# Patient Record
Sex: Male | Born: 1980 | Hispanic: Yes | Marital: Married
Health system: Southern US, Community
[De-identification: ages and names within clinical notes are randomized; demographics above are authoritative.]

---

## 2004-04-10 ENCOUNTER — Emergency Department: Payer: Self-pay | Admitting: Emergency Medicine

## 2004-09-27 ENCOUNTER — Emergency Department: Payer: Self-pay | Admitting: Emergency Medicine

## 2004-10-19 ENCOUNTER — Emergency Department: Payer: Self-pay | Admitting: Emergency Medicine

## 2004-11-12 ENCOUNTER — Emergency Department: Payer: Self-pay | Admitting: Emergency Medicine

## 2004-11-21 ENCOUNTER — Emergency Department: Payer: Self-pay | Admitting: Emergency Medicine

## 2005-01-16 ENCOUNTER — Emergency Department: Payer: Self-pay | Admitting: Emergency Medicine

## 2006-02-04 IMAGING — CR LEFT WRIST - COMPLETE 3+ VIEW
1 series · 2 of 2 positions shown · non-contrast
Comparison: none

REASON FOR EXAM: injury
COMMENTS:  LMP: (Male)

PROCEDURE:     DXR - DXR WRIST LT COMP WITH OBLIQUES  - October 19, 2004  [DATE]
RESULT:     Four views of the LEFT wrist show no fracture, dislocation or
other acute bony abnormality.

[Series 1: view not recorded · 0.17mm/px · 2 of 2 slices shown]
[im 1/2]
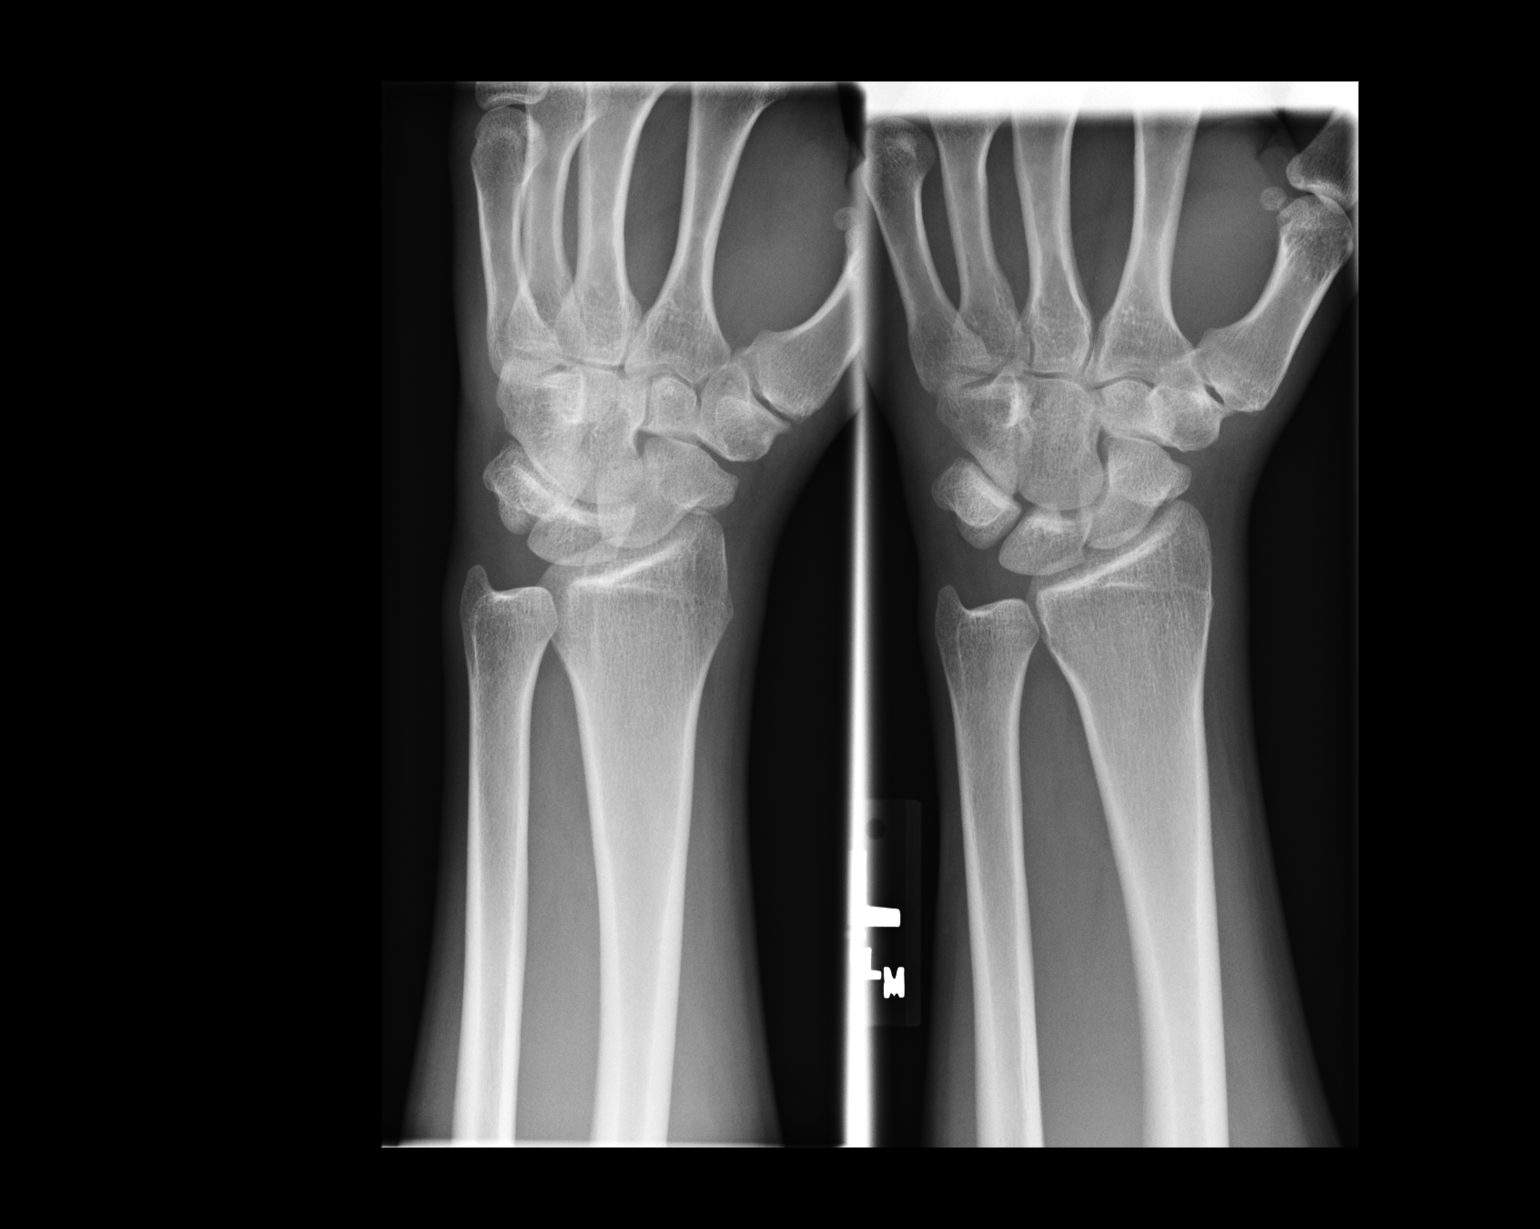
[im 2/2]
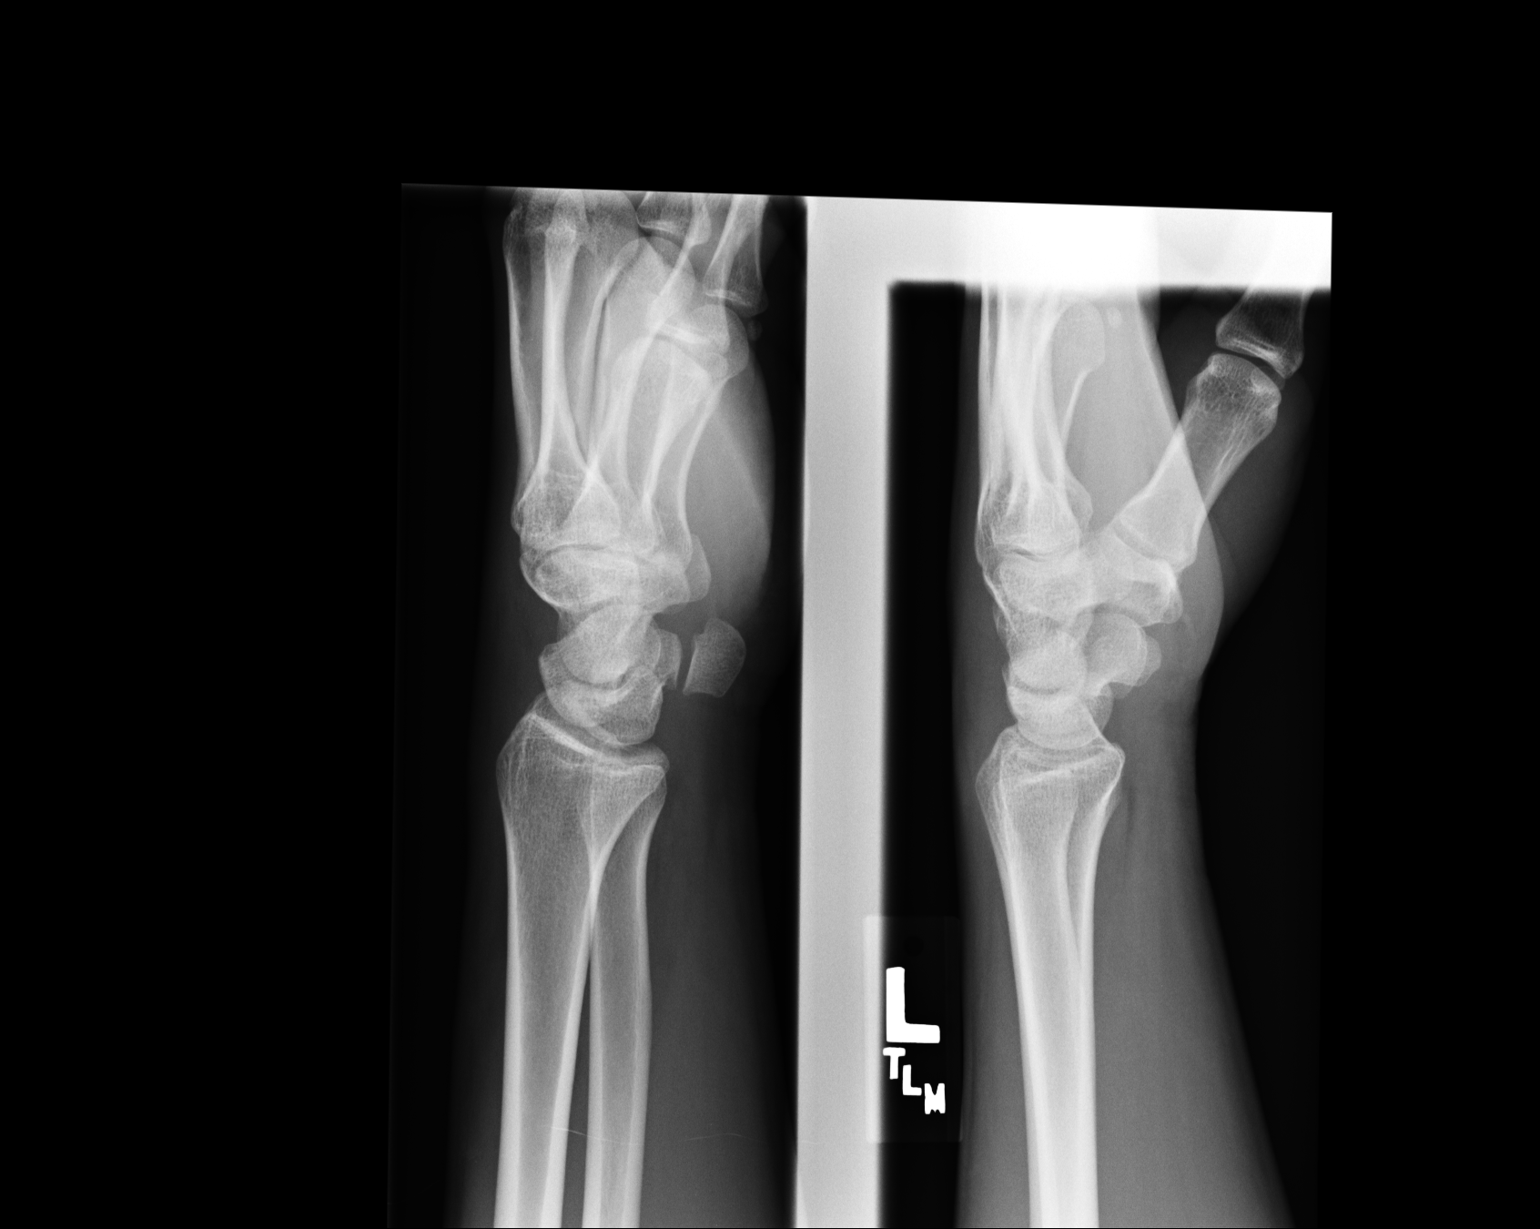

[2 of 2 positions shown; findings below may reference images not displayed]

IMPRESSION: 1)No acute changes are identified.

## 2022-05-31 ENCOUNTER — Emergency Department
Admission: EM | Admit: 2022-05-31 | Discharge: 2022-05-31 | Disposition: A | Discharge status: Home / Self Care | Payer: Medicaid Other | Attending: Emergency Medicine | Admitting: Emergency Medicine

## 2022-05-31 ENCOUNTER — Other Ambulatory Visit: Payer: Self-pay

## 2022-05-31 ENCOUNTER — Emergency Department: Payer: Medicaid Other

## 2022-05-31 DIAGNOSIS — S60512A Abrasion of left hand, initial encounter: Secondary | ICD-10-CM | POA: Diagnosis not present

## 2022-05-31 DIAGNOSIS — W540XXA Bitten by dog, initial encounter: Secondary | ICD-10-CM | POA: Diagnosis not present

## 2022-05-31 DIAGNOSIS — S71131A Puncture wound without foreign body, right thigh, initial encounter: Secondary | ICD-10-CM | POA: Diagnosis present

## 2022-05-31 MED ORDER — AMOXICILLIN-POT CLAVULANATE 875-125 MG PO TABS: 1.0000 | ORAL_TABLET | Freq: Two times a day (BID) | ORAL | 0 refills | Status: AC

## 2022-05-31 MED ORDER — TRAMADOL HCL 50 MG PO TABS: 50.0000 mg | ORAL_TABLET | Freq: Four times a day (QID) | ORAL | 0 refills | Status: AC | PRN

## 2022-05-31 MED ORDER — IBUPROFEN 800 MG PO TABS
800.0000 mg | ORAL_TABLET | Freq: Once | ORAL | Status: AC
  Administered 2022-05-31: 800 mg via ORAL
  Filled 2022-05-31: qty 1

## 2022-05-31 NOTE — ED Provider Notes (Signed)
[facility] Provider Note    Event Date/Time   First MD Initiated Contact with Patient 05/31/22 1125     (approximate)   History   Animal Bite   HPI  B[facility] is a 42 y.o. male with no significant past medical history presents emergency department with dog bite to the left hand and right thigh, patient was at his friend's house when the dog bit him.  States he does not know what happened with the dog.  It is a bully type dog.  States the dog's immunizations are up-to-date.  Patient's Tdap was approximately 3 months ago.      Physical Exam   Triage Vital Signs: ED Triage Vitals  Enc Vitals Group     BP 05/31/22 1032 (!) 140/95     Pulse Rate 05/31/22 1032 97     Resp 05/31/22 1032 16     Temp 05/31/22 1032 98.2 F (36.8 C)     Temp Source 05/31/22 1032 Oral     SpO2 05/31/22 1032 98 %     Weight 05/31/22 1031 165 lb (74.8 kg)     Height 05/31/22 1031 5\' 11"  (1.803 m)     Head Circumference --      Peak Flow --      Pain Score 05/31/22 1031 10     Pain Loc --      Pain Edu? --      Excl. in [place]? --     Most recent vital signs: Vitals:   05/31/22 1032  BP: (!) 140/95  Pulse: 97  Resp: 16  Temp: 98.2 F (36.8 C)  SpO2: 98%     General: Awake, no distress.   CV:  Good peripheral perfusion. regular rate and  rhythm Resp:  Normal effort.  Abd:  No distention.   Other:  Right thigh with multiple puncture wounds from dog bite, left hand with large abrasions, some tenderness along the first metacarpal, area still oozing, neurovascular is intact, no foreign body noted   ED Results / Procedures / Treatments   Labs (all labs ordered are listed, but only abnormal results are displayed) Labs Reviewed - No data to display   EKG     RADIOLOGY X-ray of the left hand    PROCEDURES:   Procedures   MEDICATIONS ORDERED IN ED: Medications  ibuprofen (ADVIL) tablet 800 mg (has no administration in time range)      IMPRESSION / MDM / ASSESSMENT AND PLAN / ED COURSE  I reviewed the triage vital signs and the nursing notes.                              Differential diagnosis includes, but is not limited to, laceration, dog bite, foreign body, fracture  Patient's presentation is most consistent with acute complicated illness / injury requiring diagnostic workup.   X-ray of the left hand independently reviewed and interpreted by me as being negative for any acute abnormality  The left hand and right thigh were cleaned with normal saline, nursing staff applied Steri-Strips to the hand.  I did explain to the patient we do not suture dog bites as they create a large amount of infection.  He is placed on Augmentin.  Tramadol for pain.  Return to the emergency department if any sign of infection.  Follow-up with his regular doctor as needed.  He is in agreement treatment plan.  Discharged  stable condition.      FINAL CLINICAL IMPRESSION(S) / ED DIAGNOSES   Final diagnoses:  Dog bite, initial encounter     Rx / DC Orders   ED Discharge Orders          Ordered    amoxicillin-clavulanate (AUGMENTIN) 875-125 MG tablet  2 times daily        05/31/22 1233    traMADol (ULTRAM) 50 MG tablet  Every 6 hours PRN        05/31/22 1233             Note:  This document was prepared using Dragon voice recognition software and may include unintentional dictation errors.    Versie Starks, PA-C 05/31/22 1301    Naaman Plummer, MD 05/31/22 [phone]

## 2022-05-31 NOTE — ED Triage Notes (Signed)
Pt states that he was bitten by a friends dog last night. Pt reports that dog is UTD on his shots.

## 2022-05-31 NOTE — Discharge Instructions (Signed)
Follow up with your regular doctor if not improving in 3 to 4 days Take the medication as prescribed Clean the area daily with mild soap and water

## 2022-05-31 NOTE — ED Notes (Signed)
Pt states that he was bitten by a friends dog. Pt states that the dog is UTD on his shots and he has not reported the bite. Pt has left hand wrapped. Pt is in NAD at this time.

## 2023-01-16 ENCOUNTER — Emergency Department (HOSPITAL_COMMUNITY): Payer: Medicaid Other

## 2023-01-16 ENCOUNTER — Other Ambulatory Visit: Payer: Self-pay

## 2023-01-16 ENCOUNTER — Emergency Department (HOSPITAL_COMMUNITY)
Admission: EM | Admit: 2023-01-16 | Discharge: 2023-01-16 | Disposition: A | Discharge status: Home / Self Care | Payer: Medicaid Other | Attending: Emergency Medicine | Admitting: Emergency Medicine

## 2023-01-16 DIAGNOSIS — E876 Hypokalemia: Secondary | ICD-10-CM | POA: Diagnosis not present

## 2023-01-16 DIAGNOSIS — R0789 Other chest pain: Secondary | ICD-10-CM | POA: Insufficient documentation

## 2023-01-16 LAB — BASIC METABOLIC PANEL
Anion gap: 7 (ref 5–15)
BUN: 15 mg/dL (ref 6–20)
CO2: 23 mmol/L (ref 22–32)
Calcium: 8.2 mg/dL — ABNORMAL LOW (ref 8.9–10.3)
Chloride: 107 mmol/L (ref 98–111)
Creatinine, Ser: 0.72 mg/dL (ref 0.61–1.24)
GFR, Estimated: >60 mL/min (ref >60)
Glucose, Bld: 106 mg/dL — ABNORMAL HIGH (ref 70–99)
Potassium: 3.2 mmol/L — ABNORMAL LOW (ref 3.5–5.1)
Sodium: UNDETERMINED mmol/L (ref 135–145)
Sodium: 137 mmol/L (ref 135–145)

## 2023-01-16 LAB — CBC
HCT: 45.6 % (ref 39.0–52.0)
Hemoglobin: 15 g/dL (ref 13.0–17.0)
MCH: 29.8 pg (ref 26.0–34.0)
MCHC: 32.9 g/dL (ref 30.0–36.0)
MCV: 90.5 fL (ref 80.0–100.0)
Platelets: 272 10*3/uL (ref 150–400)
RBC: 5.04 MIL/uL (ref 4.22–5.81)
RDW: 13.1 % (ref 11.5–15.5)
WBC: 11.7 10*3/uL — ABNORMAL HIGH (ref 4.0–10.5)
nRBC: 0 % (ref 0.0–0.2)

## 2023-01-16 LAB — TROPONIN I (HIGH SENSITIVITY): Troponin I (High Sensitivity): 2 ng/L (ref <18)

## 2023-01-16 MED ORDER — KETOROLAC TROMETHAMINE 30 MG/ML IJ SOLN: 30.0000 mg | Freq: Once | INTRAMUSCULAR | Status: DC

## 2023-01-16 MED ORDER — KETOROLAC TROMETHAMINE 15 MG/ML IJ SOLN: 15.0000 mg | Freq: Once | INTRAMUSCULAR | Status: DC

## 2023-01-16 NOTE — ED Provider Notes (Signed)
[place] EMERGENCY DEPARTMENT AT [facility] Provider Note   CSN: [id number] Arrival date & time: 01/16/23  4098     History  Chief Complaint  Patient presents with   Chest Pain    Eldrige J Aarion [place] is a 42 y.o. male.  HPI Patient presents with pain in the right arm radiating towards the chest. Patient denies medical problems, states that he is generally well.  Just prior to ED arrival the patient was reaching for something, felt sharp pain radiating from his right mid biceps through to his chest.  No left-sided pain, no dyspnea, no syncope, no neck pain.  Patient notes that he had a friend die of a heart attack last week, and he is concerned about this.     Home Medications Prior to Admission medications   Medication Sig Start Date End Date Taking? Authorizing Provider  amoxicillin-clavulanate (AUGMENTIN) 875-125 MG tablet Take 1 tablet by mouth 2 (two) times daily. Patient not taking: Reported on 01/16/2023 05/31/22   Faythe Ghee, PA-C  traMADol (ULTRAM) 50 MG tablet Take 1 tablet (50 mg total) by mouth every 6 (six) hours as needed. Patient not taking: Reported on 01/16/2023 05/31/22   Faythe Ghee, PA-C      Allergies    Patient has no known allergies.    Review of Systems   Review of Systems  All other systems reviewed and are negative.   Physical Exam Updated Vital Signs BP 117/86   Pulse 72   Temp 98 F (36.7 C) (Oral)   Resp 18   Ht 5\' 11"  (1.803 m)   Wt 75 kg   SpO2 96%   BMI 23.06 kg/m  Physical Exam Vitals and nursing note reviewed.  Constitutional:      General: He is not in acute distress.    Appearance: He is well-developed.  HENT:     Head: Normocephalic and atraumatic.  Eyes:     Conjunctiva/sclera: Conjunctivae normal.  Cardiovascular:     Rate and Rhythm: Normal rate and regular rhythm.  Pulmonary:     Effort: Pulmonary effort is normal. No respiratory distress.     Breath sounds: No stridor.  Abdominal:      General: There is no distension.  Musculoskeletal:       Arms:  Skin:    General: Skin is warm and dry.  Neurological:     Mental Status: He is alert and oriented to person, place, and time.     ED Results / Procedures / Treatments   Labs (all labs ordered are listed, but only abnormal results are displayed) Labs Reviewed  CBC - Abnormal; Notable for the following components:      Result Value   WBC 11.7 (*)    All other components within normal limits  BASIC METABOLIC PANEL - Abnormal; Notable for the following components:   Potassium 3.2 (*)    Glucose, Bld 106 (*)    Calcium 8.2 (*)    All other components within normal limits  BASIC METABOLIC PANEL  TROPONIN I (HIGH SENSITIVITY)  TROPONIN I (HIGH SENSITIVITY)  TROPONIN I (HIGH SENSITIVITY)    EKG EKG Interpretation Date/Time:  Tuesday January 16 2023 09:48:18 EDT Ventricular Rate:  80 PR Interval:  175 QRS Duration:  108 QT Interval:  369 QTC Calculation: 426 R Axis:   -8  Text Interpretation: Sinus rhythm Probable left atrial enlargement Confirmed by Gerhard Munch [phone]) on 01/16/2023 11:48:04 AM  Radiology DG Chest 2 View  Result Date: 01/16/2023 CLINICAL DATA:  Chest pain and pressure.  Weakness. EXAM: CHEST - 2 VIEW COMPARISON:  None Available. FINDINGS: The cardiomediastinal contours are normal. The lungs are clear. Pulmonary vasculature is normal. No consolidation, pleural effusion, or pneumothorax. No acute osseous abnormalities are seen. IMPRESSION: Negative radiographs of the chest. Electronically Signed   By: Narda Rutherford M.D.   On: 01/16/2023 11:44    Procedures Procedures    Medications Ordered in ED Medications  ketorolac (TORADOL) 15 MG/ML injection 15 mg (has no administration in time range)    ED Course/ Medical Decision Making/ A&P             HEART Score: 1                    Medical Decision Making Male without history of cardiac disease, with low risk profile, but with recent  death of a family friend due to cardiac disease presents with right-sided chest pain, shoulder pain.  Differential does include ACS, but given description of pain after reaching musculoskeletal etiology more likely. Initial labs reviewed, mild hypokalemia, normal troponin, nonischemic EKG, no evidence for ACS.  Patient declines staying for second troponin, but given his low risk profile, heart score of 1 patient's discharge, needed, he will follow-up with primary care, use ibuprofen for pain.  Amount and/or Complexity of Data Reviewed Labs: ordered. Decision-making details documented in ED Course. Radiology: ordered and independent interpretation performed. Decision-making details documented in ED Course. ECG/medicine tests: ordered and independent interpretation performed. Decision-making details documented in ED Course.  Risk Prescription drug management.  Final Clinical Impression(s) / ED Diagnoses Final diagnoses:  Atypical chest pain    Rx / DC Orders ED Discharge Orders     None         Gerhard Munch, MD 01/16/23 1501

## 2023-01-16 NOTE — ED Triage Notes (Signed)
Pt reports chest pressure and weakness x 30 minutes.

## 2023-01-16 NOTE — Discharge Instructions (Signed)
Today's evaluation has been reassuring.  With the pain in your right shoulder and right chest, please use ibuprofen, 400 mg, 3 times daily for the next 3 days.  Follow-up with your physician or return here for concerning changes in your condition.

## 2023-02-27 ENCOUNTER — Emergency Department (HOSPITAL_COMMUNITY)
Admission: EM | Admit: 2023-02-27 | Discharge: 2023-02-27 | Disposition: A | Discharge status: Home / Self Care | Payer: Medicaid Other | Attending: Emergency Medicine | Admitting: Emergency Medicine

## 2023-02-27 ENCOUNTER — Emergency Department (HOSPITAL_COMMUNITY): Payer: Medicaid Other

## 2023-02-27 ENCOUNTER — Other Ambulatory Visit: Payer: Self-pay

## 2023-02-27 DIAGNOSIS — M25532 Pain in left wrist: Secondary | ICD-10-CM | POA: Diagnosis present

## 2023-02-27 DIAGNOSIS — M66242 Spontaneous rupture of extensor tendons, left hand: Secondary | ICD-10-CM | POA: Diagnosis not present

## 2023-02-27 DIAGNOSIS — M6789 Other specified disorders of synovium and tendon, multiple sites: Secondary | ICD-10-CM

## 2023-02-27 MED ORDER — IBUPROFEN 400 MG PO TABS: 400.0000 mg | ORAL_TABLET | Freq: Four times a day (QID) | ORAL | 0 refills | Status: AC | PRN

## 2023-02-27 NOTE — Discharge Instructions (Signed)
It looks like when the dog bit you previously it may have hurt a tendon in your hand.  The braces may help.  Follow-up with the hand surgeon

## 2023-02-27 NOTE — ED Provider Notes (Signed)
[place] [place] [facility] AT [facility] Provider Note   CSN: [id number] Arrival date & time: 02/27/23  0930     History  Chief Complaint  Patient presents with   Wrist Pain    Billy Dunn is a 42 y.o. male.   Wrist Pain  Patient presents with pain and some swelling on his left wrist.  Difficulty raising his left index finger.  Previously had dog bite to the hand.  States it was rather deep but did not see a doctor.  States recently was using a nail gun and felt a pop in his wrist.  States since then has had pain swelling difficulty extending the finger.     Home Medications Prior to Admission medications   Medication Sig Start Date End Date Taking? Authorizing Provider  ibuprofen (ADVIL) 400 MG tablet Take 1 tablet (400 mg total) by mouth every 6 (six) hours as needed. 02/27/23  Yes Benjiman Core, MD  amoxicillin-clavulanate (AUGMENTIN) 875-125 MG tablet Take 1 tablet by mouth 2 (two) times daily. Patient not taking: Reported on 01/16/2023 05/31/22   Faythe Ghee, PA-C  traMADol (ULTRAM) 50 MG tablet Take 1 tablet (50 mg total) by mouth every 6 (six) hours as needed. Patient not taking: Reported on 01/16/2023 05/31/22   Faythe Ghee, PA-C      Allergies    Patient has no known allergies.    Review of Systems   Review of Systems  Physical Exam Updated Vital Signs BP 131/81 (BP Location: Right Arm)   Pulse 77   Temp 97.7 F (36.5 C) (Oral)   Resp 16   Ht 5\' 9"  (1.753 m)   Wt 74.8 kg   SpO2 100%   BMI 24.37 kg/m  Physical Exam Vitals and nursing note reviewed.  Musculoskeletal:     Comments: Does have healing scars on dorsum of hand.  There is 1 more distally on the hand but also 1 over the wrist at the site of a swelling.  Maybe some divot in this area.  Does move somewhat with extension of the left index finger.  Difficulty extending the finger.  Does have pain with even passive extension.  No erythema.  Neurological:     Mental  Status: He is alert.     ED Results / Procedures / Treatments   Labs (all labs ordered are listed, but only abnormal results are displayed) Labs Reviewed - No data to display  EKG None  Radiology DG Wrist Complete Left  Result Date: 02/27/2023 CLINICAL DATA:  Left wrist pain since yesterday after using a nail gun. EXAM: LEFT WRIST - COMPLETE 3+ VIEW COMPARISON:  Left hand radiographs-05/31/2022 FINDINGS: No fracture or dislocation. 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 Remaining joint spaces appear preserved. No erosions. No evidence of chondrocalcinosis. Regional soft tissues appear normal. IMPRESSION: 1. No acute findings. 2. 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 Electronically Signed   By: Simonne Come M.D.   On: 02/27/2023 11:11    Procedures Procedures    Medications Ordered in ED Medications - No data to display  ED Course/ Medical Decision Making/ A&P                                 Medical Decision Making Amount and/or  Complexity of Data Reviewed Radiology: ordered.   Patient with pain in left wrist and finger.  I think likely had a previous tendon injury with a dog bite.  There is a scar right over the area of the pain and swelling.  Will immobilize and have follow-up with hand surgery.  Does not appear to need acute surgery at this time.        Final Clinical Impression(s) / ED Diagnoses Final diagnoses:  Left wrist pain  Extensor tendon disruption    Rx / DC Orders ED Discharge Orders          Ordered    ibuprofen (ADVIL) 400 MG tablet  Every 6 hours PRN        02/27/23 1208              Benjiman Core, MD 02/27/23 1209

## 2023-02-27 NOTE — ED Triage Notes (Signed)
Pt came in via POV d/t hurting it "a while back" & then at work today he was doing a screwing motion with his Lt wrist & he felt a pop & the top of his wrist hurts. Cannot move all fingers & the index finger is numb. A/Ox4, rates his pain 10/10.
